# Patient Record
Sex: Male | Born: 1970 | Race: White | Hispanic: No | Marital: Single | Smoking: Current every day smoker
Health system: Southern US, Community
[De-identification: ages and names within clinical notes are randomized; demographics above are authoritative.]

## PROBLEM LIST (undated history)

## (undated) DIAGNOSIS — N200 Calculus of kidney: Secondary | ICD-10-CM

## (undated) DIAGNOSIS — Z973 Presence of spectacles and contact lenses: Secondary | ICD-10-CM

## (undated) DIAGNOSIS — K219 Gastro-esophageal reflux disease without esophagitis: Secondary | ICD-10-CM

## (undated) HISTORY — PX: LITHOTRIPSY: SUR834

## (undated) HISTORY — PX: WISDOM TOOTH EXTRACTION: SHX21

---

## 2009-06-01 ENCOUNTER — Ambulatory Visit: Payer: Self-pay | Admitting: Urology

## 2016-12-03 ENCOUNTER — Encounter: Payer: Self-pay | Admitting: Gastroenterology

## 2016-12-03 ENCOUNTER — Ambulatory Visit (INDEPENDENT_AMBULATORY_CARE_PROVIDER_SITE_OTHER): Payer: 59 | Admitting: Gastroenterology

## 2016-12-03 ENCOUNTER — Encounter (INDEPENDENT_AMBULATORY_CARE_PROVIDER_SITE_OTHER): Payer: Self-pay

## 2016-12-03 ENCOUNTER — Other Ambulatory Visit: Payer: Self-pay

## 2016-12-03 VITALS — BP 106/69 | HR 80 | Temp 97.5°F | Ht 72.0 in | Wt 207.0 lb

## 2016-12-03 DIAGNOSIS — R1310 Dysphagia, unspecified: Secondary | ICD-10-CM

## 2016-12-03 DIAGNOSIS — R1319 Other dysphagia: Secondary | ICD-10-CM

## 2016-12-03 NOTE — Progress Notes (Signed)
Gastroenterology Consultation  Referring Provider:     Linus Salmons, MD Primary Care Physician:  Patient, No Pcp Per Primary Gastroenterologist:  Dr. Servando Snare     Reason for Consultation:     Dysphagia        HPI:   Corey Cline is a 46 y.o. y/o male referred for consultation & management of Dysphagia by Dr. Patient, No Pcp Per.  This patient comes in today with a history of dysphagia.  The patient reports that sometimes he has to vomit his food up because he gets stuck.  The patient was seen by Dr. Jenne Campus and started on a PPI.  The patient states that since he started the PPI he has been eating well without any problems.  He also reports that he has been eating so much that is gained weight.  He denies any food getting stuck anymore.  The patient also denies any heartburn on the medication.  He did have some infrequent symptoms of reflux and regurgitation prior to starting the medication.  There is no report of any black stools or bloody stools.  History reviewed. No pertinent past medical history.  History reviewed. No pertinent surgical history.  Prior to Admission medications   Medication Sig Start Date End Date Taking? Authorizing Provider  omeprazole (PRILOSEC) 40 MG capsule TK 1 C PO QD 11/05/16  Yes [provider]  hyoscyamine (LEVSIN SL) 0.125 MG SL tablet  09/17/16   [provider]    History reviewed. No pertinent family history.   Social History  Substance Use Topics  . Smoking status: Current Every Day Smoker  . Smokeless tobacco: Never Used  . Alcohol use No    Allergies as of 12/03/2016  . (No Known Allergies)    Review of Systems:    All systems reviewed and negative except where noted in HPI.   Physical Exam:  BP 106/69   Pulse 80   Temp (!) 97.5 F (36.4 C) (Oral)   Ht 6' (1.829 m)   Wt 207 lb (93.9 kg)   BMI 28.07 kg/m  No LMP for male patient. Psych:  Alert and cooperative. Normal mood and affect. General:   Alert,   Well-developed, well-nourished, pleasant and cooperative in NAD Head:  Normocephalic and atraumatic. Eyes:  Sclera clear, no icterus.   Conjunctiva pink. Ears:  Normal auditory acuity. Nose:  No deformity, discharge, or lesions. Mouth:  No deformity or lesions,oropharynx pink & moist. Neck:  Supple; no masses or thyromegaly. Lungs:  Respirations even and unlabored.  Clear throughout to auscultation.   No wheezes, crackles, or rhonchi. No acute distress. Heart:  Regular rate and rhythm; no murmurs, clicks, rubs, or gallops. Abdomen:  Normal bowel sounds.  No bruits.  Soft, non-tender and non-distended without masses, hepatosplenomegaly or hernias noted.  No guarding or rebound tenderness.  Negative Carnett sign.   Rectal:  Deferred.  Msk:  Symmetrical without gross deformities.  Good, equal movement & strength bilaterally. Pulses:  Normal pulses noted. Extremities:  No clubbing or edema.  No cyanosis. Neurologic:  Alert and oriented x3;  grossly normal neurologically. Skin:  Intact without significant lesions or rashes.  No jaundice. Lymph Nodes:  No significant cervical adenopathy. Psych:  Alert and cooperative. Normal mood and affect.  Imaging Studies: No results found.  Assessment and Plan:   Corey Cline is a 46 y.o. y/o male who comes in today with a history of long-standing heartburn with dysphagia.  The patient has been  doing well on omeprazole.  The patient has had food stuck in the past and has been set up for a upper endoscopy to rule out any esophageal stricture or web. At the time of the endoscopy we can also rule out any sign of Barrett's esophagus. I have discussed risks & benefits which include, but are not limited to, bleeding, infection, perforation & drug reaction.  The patient agrees with this plan & written consent will be obtained.     Corey Miniumarren Evertte Sones, MD. Clementeen GrahamFACG   Note: This dictation was prepared with Dragon dictation along with smaller phrase technology. Any  transcriptional errors that result from this process are unintentional.

## 2016-12-09 ENCOUNTER — Encounter: Payer: Self-pay | Admitting: *Deleted

## 2016-12-09 NOTE — Discharge Instructions (Signed)
General Anesthesia, Adult, Care After °These instructions provide you with information about caring for yourself after your procedure. Your health care provider may also give you more specific instructions. Your treatment has been planned according to current medical practices, but problems sometimes occur. Call your health care provider if you have any problems or questions after your procedure. °What can I expect after the procedure? °After the procedure, it is common to have: °· Vomiting. °· A sore throat. °· Mental slowness. ° °It is common to feel: °· Nauseous. °· Cold or shivery. °· Sleepy. °· Tired. °· Sore or achy, even in parts of your body where you did not have surgery. ° °Follow these instructions at home: °For at least 24 hours after the procedure: °· Do not: °? Participate in activities where you could fall or become injured. °? Drive. °? Use heavy machinery. °? Drink alcohol. °? Take sleeping pills or medicines that cause drowsiness. °? Make important decisions or sign legal documents. °? Take care of children on your own. °· Rest. °Eating and drinking °· If you vomit, drink water, juice, or soup when you can drink without vomiting. °· Drink enough fluid to keep your urine clear or pale yellow. °· Make sure you have little or no nausea before eating solid foods. °· Follow the diet recommended by your health care provider. °General instructions °· Have a responsible adult stay with you until you are awake and alert. °· Return to your normal activities as told by your health care provider. Ask your health care provider what activities are safe for you. °· Take over-the-counter and prescription medicines only as told by your health care provider. °· If you smoke, do not smoke without supervision. °· Keep all follow-up visits as told by your health care provider. This is important. °Contact a health care provider if: °· You continue to have nausea or vomiting at home, and medicines are not helpful. °· You  cannot drink fluids or start eating again. °· You cannot urinate after 8-12 hours. °· You develop a skin rash. °· You have fever. °· You have increasing redness at the site of your procedure. °Get help right away if: °· You have difficulty breathing. °· You have chest pain. °· You have unexpected bleeding. °· You feel that you are having a life-threatening or urgent problem. °This information is not intended to replace advice given to you by your health care provider. Make sure you discuss any questions you have with your health care provider. °Document Released: 06/17/2000 Document Revised: 08/14/2015 Document Reviewed: 02/23/2015 °Elsevier Interactive Patient Education © 2018 [employer] ° °

## 2016-12-12 ENCOUNTER — Encounter
Admission: RE | Disposition: A | Discharge status: Home / Self Care | Payer: Self-pay | Source: Ambulatory Visit | Attending: Gastroenterology

## 2016-12-12 ENCOUNTER — Ambulatory Visit: Payer: 59 | Admitting: Anesthesiology

## 2016-12-12 ENCOUNTER — Ambulatory Visit
Admission: RE | Admit: 2016-12-12 | Discharge: 2016-12-12 | Disposition: A | Discharge status: Home / Self Care | Payer: 59 | Source: Ambulatory Visit | Attending: Gastroenterology | Admitting: Gastroenterology

## 2016-12-12 DIAGNOSIS — Z79899 Other long term (current) drug therapy: Secondary | ICD-10-CM | POA: Insufficient documentation

## 2016-12-12 DIAGNOSIS — Z87442 Personal history of urinary calculi: Secondary | ICD-10-CM | POA: Diagnosis not present

## 2016-12-12 DIAGNOSIS — R1310 Dysphagia, unspecified: Secondary | ICD-10-CM | POA: Diagnosis present

## 2016-12-12 DIAGNOSIS — K222 Esophageal obstruction: Secondary | ICD-10-CM | POA: Diagnosis not present

## 2016-12-12 DIAGNOSIS — F17210 Nicotine dependence, cigarettes, uncomplicated: Secondary | ICD-10-CM | POA: Insufficient documentation

## 2016-12-12 DIAGNOSIS — K219 Gastro-esophageal reflux disease without esophagitis: Secondary | ICD-10-CM | POA: Diagnosis not present

## 2016-12-12 HISTORY — DX: Gastro-esophageal reflux disease without esophagitis: K21.9

## 2016-12-12 HISTORY — DX: Presence of spectacles and contact lenses: Z97.3

## 2016-12-12 HISTORY — DX: Calculus of kidney: N20.0

## 2016-12-12 HISTORY — PX: ESOPHAGEAL DILATION: SHX303

## 2016-12-12 HISTORY — PX: ESOPHAGOGASTRODUODENOSCOPY (EGD) WITH PROPOFOL: SHX5813

## 2016-12-12 SURGERY — ESOPHAGOGASTRODUODENOSCOPY (EGD) WITH PROPOFOL
Anesthesia: General

## 2016-12-12 MED ORDER — GLYCOPYRROLATE 0.2 MG/ML IJ SOLN
INTRAMUSCULAR | Status: DC | PRN
  Administered 2016-12-12: 0.1 mg via INTRAVENOUS

## 2016-12-12 MED ORDER — LACTATED RINGERS IV SOLN
10.0000 mL/h | INTRAVENOUS | Status: DC
  Administered 2016-12-12: 10 mL/h via INTRAVENOUS

## 2016-12-12 MED ORDER — ACETAMINOPHEN 325 MG PO TABS: 325.0000 mg | ORAL_TABLET | ORAL | Status: DC | PRN

## 2016-12-12 MED ORDER — ONDANSETRON HCL 4 MG/2ML IJ SOLN: 4.0000 mg | Freq: Once | INTRAMUSCULAR | Status: DC | PRN

## 2016-12-12 MED ORDER — LIDOCAINE HCL (CARDIAC) 20 MG/ML IV SOLN
INTRAVENOUS | Status: DC | PRN
  Administered 2016-12-12: 50 mg via INTRAVENOUS

## 2016-12-12 MED ORDER — STERILE WATER FOR IRRIGATION IR SOLN
Status: DC | PRN
  Administered 2016-12-12: 09:00:00

## 2016-12-12 MED ORDER — SODIUM CHLORIDE 0.9 % IV SOLN: INTRAVENOUS | Status: DC

## 2016-12-12 MED ORDER — PROPOFOL 10 MG/ML IV BOLUS
INTRAVENOUS | Status: DC | PRN
  Administered 2016-12-12 (×3): 30 mg via INTRAVENOUS
  Administered 2016-12-12: 20 mg via INTRAVENOUS
  Administered 2016-12-12: 120 mg via INTRAVENOUS

## 2016-12-12 MED ORDER — ACETAMINOPHEN 160 MG/5ML PO SOLN: 325.0000 mg | ORAL | Status: DC | PRN

## 2016-12-12 SURGICAL SUPPLY — 32 items
BALLN DILATOR 10-12 8 (BALLOONS)
BALLN DILATOR 12-15 8 (BALLOONS)
BALLN DILATOR 15-18 8 (BALLOONS) ×3
BALLN DILATOR CRE 0-12 8 (BALLOONS)
BALLN DILATOR ESOPH 8 10 CRE (MISCELLANEOUS) IMPLANT
BALLOON DILATOR 12-15 8 (BALLOONS) IMPLANT
BALLOON DILATOR 15-18 8 (BALLOONS) ×1 IMPLANT
BALLOON DILATOR CRE 0-12 8 (BALLOONS) IMPLANT
BLOCK BITE 60FR ADLT L/F GRN (MISCELLANEOUS) ×3 IMPLANT
CANISTER SUCT 1200ML W/VALVE (MISCELLANEOUS) ×3 IMPLANT
CLIP HMST 235XBRD CATH ROT (MISCELLANEOUS) IMPLANT
CLIP RESOLUTION 360 11X235 (MISCELLANEOUS)
FCP ESCP3.2XJMB 240X2.8X (MISCELLANEOUS)
FORCEPS BIOP RAD 4 LRG CAP 4 (CUTTING FORCEPS) IMPLANT
FORCEPS BIOP RJ4 240 W/NDL (MISCELLANEOUS)
FORCEPS ESCP3.2XJMB 240X2.8X (MISCELLANEOUS) IMPLANT
GOWN CVR UNV OPN BCK APRN NK (MISCELLANEOUS) ×2 IMPLANT
GOWN ISOL THUMB LOOP REG UNIV (MISCELLANEOUS) ×4
INJECTOR VARIJECT VIN23 (MISCELLANEOUS) IMPLANT
KIT DEFENDO VALVE AND CONN (KITS) IMPLANT
KIT ENDO PROCEDURE OLY (KITS) ×3 IMPLANT
MARKER SPOT ENDO TATTOO 5ML (MISCELLANEOUS) IMPLANT
PAD GROUND ADULT SPLIT (MISCELLANEOUS) IMPLANT
RETRIEVER NET PLAT FOOD (MISCELLANEOUS) IMPLANT
SNARE SHORT THROW 13M SML OVAL (MISCELLANEOUS) IMPLANT
SNARE SHORT THROW 30M LRG OVAL (MISCELLANEOUS) IMPLANT
SPOT EX ENDOSCOPIC TATTOO (MISCELLANEOUS)
SYR INFLATION 60ML (SYRINGE) IMPLANT
TRAP ETRAP POLY (MISCELLANEOUS) IMPLANT
VARIJECT INJECTOR VIN23 (MISCELLANEOUS)
WATER STERILE IRR 250ML POUR (IV SOLUTION) ×3 IMPLANT
WIRE CRE 18-20MM 8CM F G (MISCELLANEOUS) IMPLANT

## 2016-12-12 NOTE — Anesthesia Preprocedure Evaluation (Signed)
Anesthesia Evaluation  Patient identified by MRN, date of birth, ID band Patient awake    Reviewed: Allergy & Precautions, NPO status   Airway Mallampati: I  TM Distance: >3 FB     Dental no notable dental hx.    Pulmonary Current Smoker,    breath sounds clear to auscultation       Cardiovascular negative cardio ROS   Rhythm:Regular Rate:Normal     Neuro/Psych negative neurological ROS     GI/Hepatic GERD  Controlled,  Endo/Other  negative endocrine ROS  Renal/GU      Musculoskeletal   Abdominal   Peds  Hematology   Anesthesia Other Findings   Reproductive/Obstetrics                            Anesthesia Physical Anesthesia Plan  ASA: II  Anesthesia Plan: General   Post-op Pain Management:    Induction: Intravenous  PONV Risk Score and Plan:   Airway Management Planned: Nasal Cannula and Natural Airway  Additional Equipment:   Intra-op Plan:   Post-operative Plan:   Informed Consent: I have reviewed the patients History and Physical, chart, labs and discussed the procedure including the risks, benefits and alternatives for the proposed anesthesia with the patient or authorized representative who has indicated his/her understanding and acceptance.   Dental advisory given  Plan Discussed with: CRNA  Anesthesia Plan Comments:         Anesthesia Quick Evaluation

## 2016-12-12 NOTE — Anesthesia Procedure Notes (Signed)
Procedure Name: MAC Date/Time: 12/12/2016 8:57 AM Performed by: Cameron Ali Pre-anesthesia Checklist: Patient identified, Emergency Drugs available, Suction available, Timeout performed and Patient being monitored Patient Re-evaluated:Patient Re-evaluated prior to induction Oxygen Delivery Method: Nasal cannula Placement Confirmation: positive ETCO2

## 2016-12-12 NOTE — Op Note (Signed)
[facility] Gastroenterology Patient Name: Corey Cline Procedure Date: 12/12/2016 8:48 AM MRN: [id number] Account #: [id number] Date of Birth: 19-May-1970 Admit Type: Outpatient Age: 46 Room: [facility] OR ROOM 01 Gender: Male Note Status: Finalized Procedure:            Upper GI endoscopy Indications:          Dysphagia Providers:            Midge Minium MD, MD Referring MD:         Kris Mouton. Gerilyn Pilgrim (Referring MD) Medicines:            Propofol per Anesthesia Complications:        No immediate complications. Procedure:            Pre-Anesthesia Assessment:                       - Prior to the procedure, a History and Physical was                        performed, and patient medications and allergies were                        reviewed. The patient's tolerance of previous                        anesthesia was also reviewed. The risks and benefits of                        the procedure and the sedation options and risks were                        discussed with the patient. All questions were                        answered, and informed consent was obtained. Prior                        Anticoagulants: The patient has taken no previous                        anticoagulant or antiplatelet agents. ASA Grade                        Assessment: II - A patient with mild systemic disease.                        After reviewing the risks and benefits, the patient was                        deemed in satisfactory condition to undergo the                        procedure.                       After obtaining informed consent, the endoscope was                        passed under direct vision. Throughout the procedure,  the patient's blood pressure, pulse, and oxygen                        saturations were monitored continuously. The Olympus                        GIF-HQ190 Endoscope (S#. [id number]) was introduced                        through the  mouth, and advanced to the second part of                        duodenum. The upper GI endoscopy was accomplished                        without difficulty. The patient tolerated the procedure                        well. Findings:      One moderate benign-appearing, intrinsic stenosis was found at the       gastroesophageal junction. And was traversed. A TTS dilator was passed       through the scope. Dilation with a 12-13.5-15 mm balloon dilator was       performed to 15 mm. The dilation site was examined following endoscope       reinsertion and showed moderate improvement in luminal narrowing.      The stomach was normal.      The examined duodenum was normal. Impression:           - Benign-appearing esophageal stenosis. Dilated.                       - Normal stomach.                       - Normal examined duodenum.                       - No specimens collected. Recommendation:       - Discharge patient to home.                       - Resume previous diet.                       - Continue present medications.                       - Repeat upper endoscopy PRN for retreatment. Procedure Code(s):    --- Professional ---                       [phone], Esophagogastroduodenoscopy, flexible, transoral;                        with transendoscopic balloon dilation of esophagus                        (less than 30 mm diameter) Diagnosis Code(s):    --- Professional ---                       R13.10, Dysphagia, unspecified  K22.2, Esophageal obstruction CPT copyright 2016 American Medical Association. All rights reserved. The codes documented in this report are preliminary and upon coder review may  be revised to meet current compliance requirements. Midge Minium MD, MD 12/12/2016 9:13:02 AM This report has been signed electronically. Number of Addenda: 0 Note Initiated On: 12/12/2016 8:48 AM      [facility]

## 2016-12-12 NOTE — Anesthesia Postprocedure Evaluation (Signed)
Anesthesia Post Note  Patient: Corey Cline  Procedure(s) Performed: Procedure(s) (LRB): ESOPHAGOGASTRODUODENOSCOPY (EGD) WITH PROPOFOL (N/A) ESOPHAGEAL DILATION (N/A)  Patient location during evaluation: PACU Anesthesia Type: General Level of consciousness: awake Pain management: pain level controlled Vital Signs Assessment: post-procedure vital signs reviewed and stable Respiratory status: respiratory function stable Cardiovascular status: stable Postop Assessment: no signs of nausea or vomiting Anesthetic complications: no    Jola Babinski

## 2016-12-12 NOTE — Transfer of Care (Signed)
Immediate Anesthesia Transfer of Care Note  Patient: Corey Cline  Procedure(s) Performed: Procedure(s) with comments: ESOPHAGOGASTRODUODENOSCOPY (EGD) WITH PROPOFOL (N/A) - requests early ESOPHAGEAL DILATION (N/A)  Patient Location: PACU  Anesthesia Type: General  Level of Consciousness: awake, alert  and patient cooperative  Airway and Oxygen Therapy: Patient Spontanous Breathing and Patient connected to supplemental oxygen  Post-op Assessment: Post-op Vital signs reviewed, Patient's Cardiovascular Status Stable, Respiratory Function Stable, Patent Airway and No signs of Nausea or vomiting  Post-op Vital Signs: Reviewed and stable  Complications: No apparent anesthesia complications

## 2016-12-12 NOTE — H&P (Signed)
   Midge Minium, MD [facility] [address] [place], [place] [address] [phone] Fax : [phone]  Primary Care Physician:  Patient, No Pcp Per Primary Gastroenterologist:  Dr. Servando Snare  Pre-Procedure History & Physical: HPI:  Corey Cline is a 46 y.o. male is here for an endoscopy.   Past Medical History:  Diagnosis Date  . GERD (gastroesophageal reflux disease)   . Kidney stones   . Wears contact lenses     Past Surgical History:  Procedure Laterality Date  . LITHOTRIPSY    . WISDOM TOOTH EXTRACTION      Prior to Admission medications   Medication Sig Start Date End Date Taking? Authorizing Provider  omeprazole (PRILOSEC) 40 MG capsule TK 1 C PO QD 11/05/16  Yes [provider]  hyoscyamine (LEVSIN SL) 0.125 MG SL tablet  09/17/16   [provider]    Allergies as of 12/03/2016  . (No Known Allergies)    History reviewed. No pertinent family history.  Social History   Social History  . Marital status: Single    Spouse name: N/A  . Number of children: N/A  . Years of education: N/A   Occupational History  . Not on file.   Social History Main Topics  . Smoking status: Current Every Day Smoker    Packs/day: 0.25    Years: 10.00  . Smokeless tobacco: Never Used  . Alcohol use 3.6 oz/week    6 Cans of beer per week  . Drug use: No  . Sexual activity: Not on file   Other Topics Concern  . Not on file   Social History Narrative  . No narrative on file    Review of Systems: See HPI, otherwise negative ROS  Physical Exam: BP 105/76   Pulse 63   Temp 98.1 F (36.7 C) (Temporal)   Resp 16   Ht 6' (1.829 m)   Wt 204 lb (92.5 kg)   SpO2 98%   BMI 27.67 kg/m  General:   Alert,  pleasant and cooperative in NAD Head:  Normocephalic and atraumatic. Neck:  Supple; no masses or thyromegaly. Lungs:  Clear throughout to auscultation.    Heart:  Regular rate and rhythm. Abdomen:  Soft, nontender and nondistended. Normal  bowel sounds, without guarding, and without rebound.   Neurologic:  Alert and  oriented x4;  grossly normal neurologically.  Impression/Plan: Corey Cline is here for an endoscopy to be performed for Dysphagia  Risks, benefits, limitations, and alternatives regarding  endoscopy have been reviewed with the patient.  Questions have been answered.  All parties agreeable.   Midge Minium, MD  12/12/2016, 8:21 AM

## 2016-12-13 ENCOUNTER — Encounter: Payer: Self-pay | Admitting: Gastroenterology

## 2018-03-05 ENCOUNTER — Emergency Department: Payer: 59

## 2018-03-05 ENCOUNTER — Encounter: Payer: Self-pay | Admitting: Emergency Medicine

## 2018-03-05 ENCOUNTER — Emergency Department
Admission: EM | Admit: 2018-03-05 | Discharge: 2018-03-05 | Disposition: A | Discharge status: Home / Self Care | Payer: 59 | Attending: Emergency Medicine | Admitting: Emergency Medicine

## 2018-03-05 DIAGNOSIS — Z79899 Other long term (current) drug therapy: Secondary | ICD-10-CM | POA: Diagnosis not present

## 2018-03-05 DIAGNOSIS — N200 Calculus of kidney: Secondary | ICD-10-CM | POA: Insufficient documentation

## 2018-03-05 DIAGNOSIS — F17210 Nicotine dependence, cigarettes, uncomplicated: Secondary | ICD-10-CM | POA: Diagnosis not present

## 2018-03-05 DIAGNOSIS — R339 Retention of urine, unspecified: Secondary | ICD-10-CM | POA: Diagnosis present

## 2018-03-05 DIAGNOSIS — R109 Unspecified abdominal pain: Secondary | ICD-10-CM | POA: Diagnosis not present

## 2018-03-05 LAB — URINALYSIS, COMPLETE (UACMP) WITH MICROSCOPIC
BACTERIA UA: NONE SEEN
Bilirubin Urine: NEGATIVE
Glucose, UA: NEGATIVE mg/dL
KETONES UR: NEGATIVE mg/dL
Leukocytes, UA: NEGATIVE
Nitrite: NEGATIVE
Protein, ur: 30 mg/dL — AB
RBC / HPF: >50 RBC/hpf — ABNORMAL HIGH (ref 0–5)
Specific Gravity, Urine: 1.011 (ref 1.005–1.030)
pH: 6 (ref 5.0–8.0)

## 2018-03-05 LAB — COMPREHENSIVE METABOLIC PANEL
ALBUMIN: 4.7 g/dL (ref 3.5–5.0)
ALK PHOS: 72 U/L (ref 38–126)
ALT: 33 U/L (ref 0–44)
ANION GAP: 9 (ref 5–15)
AST: 31 U/L (ref 15–41)
BUN: 10 mg/dL (ref 6–20)
CALCIUM: 9.6 mg/dL (ref 8.9–10.3)
CO2: 24 mmol/L (ref 22–32)
Chloride: 104 mmol/L (ref 98–111)
Creatinine, Ser: 0.85 mg/dL (ref 0.61–1.24)
GFR calc Af Amer: >60 mL/min (ref >60)
GFR calc non Af Amer: >60 mL/min (ref >60)
Glucose, Bld: 136 mg/dL — ABNORMAL HIGH (ref 70–99)
Potassium: 3.8 mmol/L (ref 3.5–5.1)
Sodium: 137 mmol/L (ref 135–145)
TOTAL PROTEIN: 8.2 g/dL — AB (ref 6.5–8.1)
Total Bilirubin: 1 mg/dL (ref 0.3–1.2)

## 2018-03-05 LAB — CBC
HEMATOCRIT: 48.7 % (ref 39.0–52.0)
Hemoglobin: 16.4 g/dL (ref 13.0–17.0)
MCH: 29.8 pg (ref 26.0–34.0)
MCHC: 33.7 g/dL (ref 30.0–36.0)
MCV: 88.5 fL (ref 80.0–100.0)
NRBC: 0 % (ref 0.0–0.2)
Platelets: 284 10*3/uL (ref 150–400)
RBC: 5.5 MIL/uL (ref 4.22–5.81)
RDW: 13 % (ref 11.5–15.5)
WBC: 19.5 10*3/uL — ABNORMAL HIGH (ref 4.0–10.5)

## 2018-03-05 MED ORDER — ONDANSETRON HCL 4 MG PO TABS: 4.0000 mg | ORAL_TABLET | Freq: Three times a day (TID) | ORAL | 0 refills | Status: AC | PRN

## 2018-03-05 MED ORDER — OXYCODONE-ACETAMINOPHEN 5-325 MG PO TABS: 1.0000 | ORAL_TABLET | ORAL | 0 refills | Status: AC | PRN

## 2018-03-05 MED ORDER — SODIUM CHLORIDE 0.9 % IV BOLUS
1000.0000 mL | Freq: Once | INTRAVENOUS | Status: AC
  Administered 2018-03-05: 1000 mL via INTRAVENOUS

## 2018-03-05 MED ORDER — IBUPROFEN 200 MG PO TABS: 600.0000 mg | ORAL_TABLET | Freq: Four times a day (QID) | ORAL | 0 refills | Status: AC | PRN

## 2018-03-05 MED ORDER — TAMSULOSIN HCL 0.4 MG PO CAPS: 0.4000 mg | ORAL_CAPSULE | Freq: Every day | ORAL | 0 refills | Status: AC

## 2018-03-05 NOTE — ED Provider Notes (Addendum)
[facility] Emergency Department Provider Note  ____________________________________________   I have reviewed the triage vital signs and the nursing notes. Where available I have reviewed prior notes and, if possible and indicated, outside hospital notes.    HISTORY  Chief Complaint Urinary Retention; Flank Pain; and Back Pain    HPI Corey Cline is a 47 y.o. male the history he states of kidney stones, although we have no significant records here aside from 1 KUB to suggest that that is the case, presents with sudden onset back pain which resulted in some degree of bladder spasm or what he describes as bladder spasm.  He states he was having difficulty completing his urination and has not urinated since.  Does not have any fever chills vomiting or diarrhea.  Pain is in the right flank.  Seems similar to prior kidney stones.  No numbness no weakness no other complaints   Past Medical History:  Diagnosis Date  . GERD (gastroesophageal reflux disease)   . Kidney stones   . Wears contact lenses     There are no active problems to display for this patient.   Past Surgical History:  Procedure Laterality Date  . ESOPHAGEAL DILATION N/A 12/12/2016   Procedure: ESOPHAGEAL DILATION;  Surgeon: Midge Minium, MD;  Location: [facility];  Service: Gastroenterology;  Laterality: N/A;  . ESOPHAGOGASTRODUODENOSCOPY (EGD) WITH PROPOFOL N/A 12/12/2016   Procedure: ESOPHAGOGASTRODUODENOSCOPY (EGD) WITH PROPOFOL;  Surgeon: Midge Minium, MD;  Location: [facility];  Service: Gastroenterology;  Laterality: N/A;  requests early  . LITHOTRIPSY    . WISDOM TOOTH EXTRACTION      Prior to Admission medications   Medication Sig Start Date End Date Taking? Authorizing Provider  hyoscyamine (LEVSIN SL) 0.125 MG SL tablet  09/17/16   [provider]  omeprazole (PRILOSEC) 40 MG capsule TK 1 C PO QD 11/05/16   [provider]     Allergies Patient has no known allergies.  No family history on file.  Social History Social History   Tobacco Use  . Smoking status: Current Every Day Smoker    Packs/day: 0.25    Years: 10.00    Pack years: 2.50  . Smokeless tobacco: Never Used  Substance Use Topics  . Alcohol use: Yes    Alcohol/week: 6.0 standard drinks    Types: 6 Cans of beer per week  . Drug use: No    Review of Systems Constitutional: No fever/chills Eyes: No visual changes. ENT: No sore throat. No stiff neck no neck pain Cardiovascular: Denies chest pain. Respiratory: Denies shortness of breath. Gastrointestinal:   no vomiting.  No diarrhea.  No constipation. Genitourinary: Negative for dysuria. Musculoskeletal: Negative lower extremity swelling Skin: Negative for rash. Neurological: Negative for severe headaches, focal weakness or numbness.   ____________________________________________   PHYSICAL EXAM:  VITAL SIGNS: ED Triage Vitals [03/05/18 1010]  Enc Vitals Group     BP 138/87     Pulse Rate 67     Resp 20     Temp 97.6 F (36.4 C)     Temp Source Oral     SpO2 99 %     Weight 200 lb (90.7 kg)     Height 6' (1.829 m)     Head Circumference      Peak Flow      Pain Score 7     Pain Loc      Pain Edu?      Excl. in GC?  Constitutional: Alert and oriented. Well appearing and in no acute distress. Eyes: Conjunctivae are normal Head: Atraumatic HEENT: No congestion/rhinnorhea. Mucous membranes are moist.  Oropharynx non-erythematous Neck:   Nontender with no meningismus, no masses, no stridor Cardiovascular: Normal rate, regular rhythm. Grossly normal heart sounds.  Good peripheral circulation. Respiratory: Normal respiratory effort.  No retractions. Lungs CTAB. Abdominal: Soft and nontender. No distention. No guarding no rebound Back:  There is no focal tenderness or step off.  there is no midline tenderness there are no lesions noted. there is some right CVA  tenderness Normal external genitalia, no lesions or masses Musculoskeletal: No lower extremity tenderness, no upper extremity tenderness. No joint effusions, no DVT signs strong distal pulses no edema Neurologic:  Normal speech and language. No gross focal neurologic deficits are appreciated.  Skin:  Skin is warm, dry and intact. No rash noted. Psychiatric: Mood and affect are normal. Speech and behavior are normal.  ____________________________________________   LABS (all labs ordered are listed, but only abnormal results are displayed)  Labs Reviewed  CBC - Abnormal; Notable for the following components:      Result Value   WBC 19.5 (*)    All other components within normal limits  COMPREHENSIVE METABOLIC PANEL - Abnormal; Notable for the following components:   Glucose, Bld 136 (*)    Total Protein 8.2 (*)    All other components within normal limits  URINALYSIS, COMPLETE (UACMP) WITH MICROSCOPIC    Pertinent labs  results that were available during my care of the patient were reviewed by me and considered in my medical decision making (see chart for details). ____________________________________________  EKG  I personally interpreted any EKGs ordered by me or triage  ____________________________________________  RADIOLOGY  Pertinent labs & imaging results that were available during my care of the patient were reviewed by me and considered in my medical decision making (see chart for details). If possible, patient and/or family made aware of any abnormal findings.  No results found. ____________________________________________    PROCEDURES  Procedure(s) performed: None  Procedures  Critical Care performed: None  ____________________________________________   INITIAL IMPRESSION / ASSESSMENT AND PLAN / ED COURSE  Pertinent labs & imaging results that were available during my care of the patient were reviewed by me and considered in my medical decision making  (see chart for details).   Patient here with sudden onset pain, he states at this time his pain is nearly gone and he does not want any pain medication he understands that if he changes his mind we can certainly give him some.  His bladder scan does not show any significant urinary retention he is most likely having bladder him from the attempt to pass a stone.  Do see a prior x-ray with stones and I do not see any prior CT scan.  Given his white count of nearly 20, his complaints of urinary difficulty, all of which certainly could be an uninfected kidney stone, I do think it might be in his best interest to have better imaging we will obtain a CT scan.  Urinalysis is pending I am going to give him IV fluid.  He is in no acute distress resting comfortably in the bed most likely this is renal colic.  ----------------------------------------- 1:23 PM on 03/05/2018 -----------------------------------------  Continues to pain complain of no pain at this time, he states sometimes it comes and goes before he can call the nurse and at this time he is pain-free, he does not want  pain medication here but he does want it at home.  No evidence of infected stone, we will send a urine culture as a precaution although again no reason to suspect that this man has a urinary tract infection.  He will follow closely with his urologist return precautions follow-up given and understood   ____________________________________________   FINAL CLINICAL IMPRESSION(S) / ED DIAGNOSES  Final diagnoses:  None      This chart was dictated using voice recognition software.  Despite best efforts to proofread,  errors can occur which can change meaning.      Jeanmarie PlantMcShane, Kalilah Barua A, MD 03/05/18 1137    Jeanmarie PlantMcShane, Irwin Toran A, MD 03/05/18 1323

## 2018-03-05 NOTE — ED Notes (Signed)
Patient transported to CT 

## 2018-03-05 NOTE — ED Notes (Signed)
Pt verbalizes d.c understanding , follow up and RX. Pt in NAD, VSS, pt unable to sign due to signature pad malfnx

## 2018-03-05 NOTE — ED Notes (Signed)
Bladder scanner results 102 ml.

## 2018-03-05 NOTE — ED Triage Notes (Signed)
Pt reports awoke at 3am tom use the restroom was only able to get a small amount out and has pain to his right lower back/flank. Pt reports NV as well. Pt with hx of stones and states that it feels the same.

## 2018-03-06 LAB — URINE CULTURE: Culture: NO GROWTH

## 2019-05-05 IMAGING — CT CT RENAL STONE PROTOCOL
2 of 4 series · 16 of 46 positions shown, 18 images · non-contrast
Comparison: None.

CLINICAL DATA: Flank pain, right side

EXAM:
CT ABDOMEN AND PELVIS WITHOUT CONTRAST
TECHNIQUE: Multidetector CT imaging of the abdomen and pelvis was performed
following the standard protocol without oral or IV contrast.

[Series 2: stone full standard · axial · 0.73mm/px · z∈[-527,-67]mm · 13 of 100 slices shown, 15 images]
[im 4/100  soft-tissue]
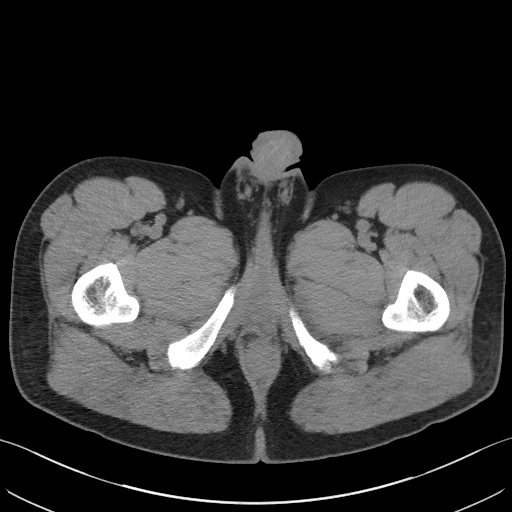
[im 4/100  bone]
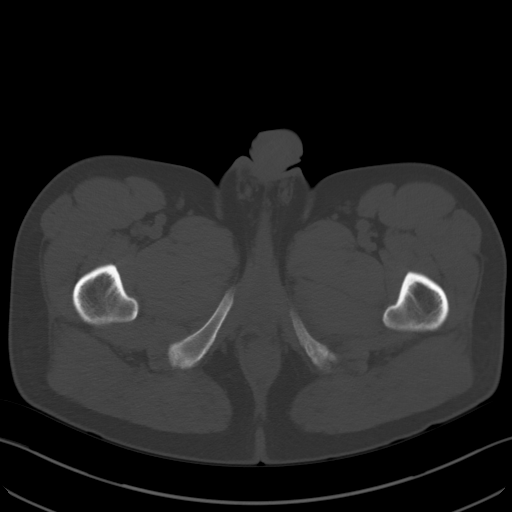
[im 12/100  soft-tissue]
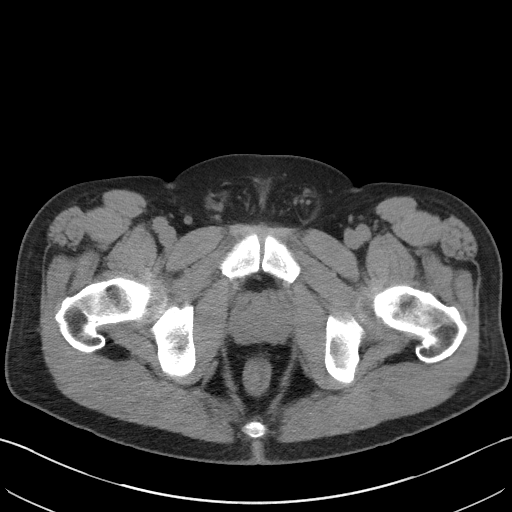
[im 20/100  soft-tissue]
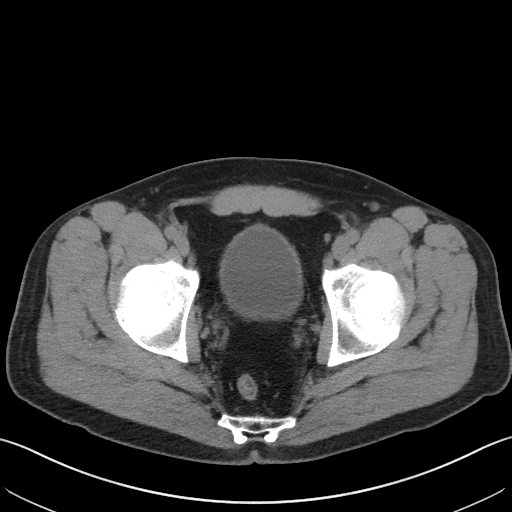
[im 28/100  soft-tissue]
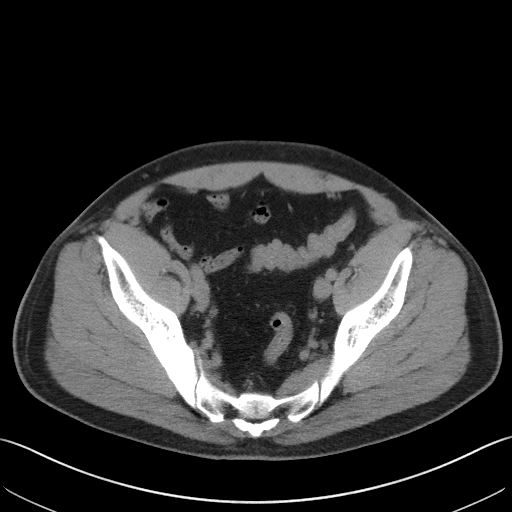
[im 36/100  soft-tissue]
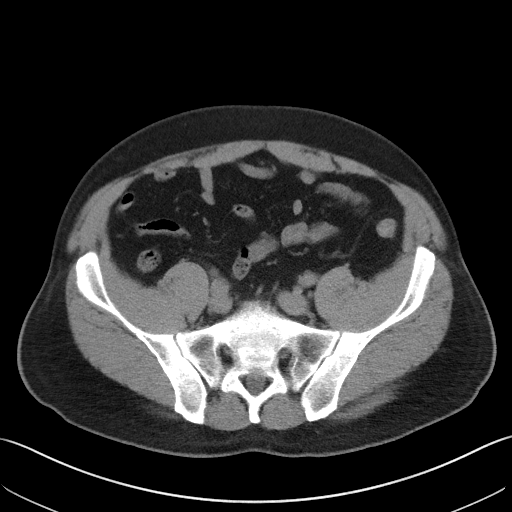
[im 44/100  soft-tissue]
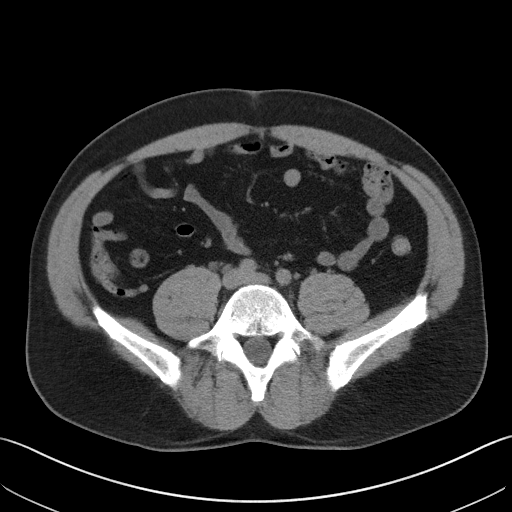
[im 52/100  soft-tissue]
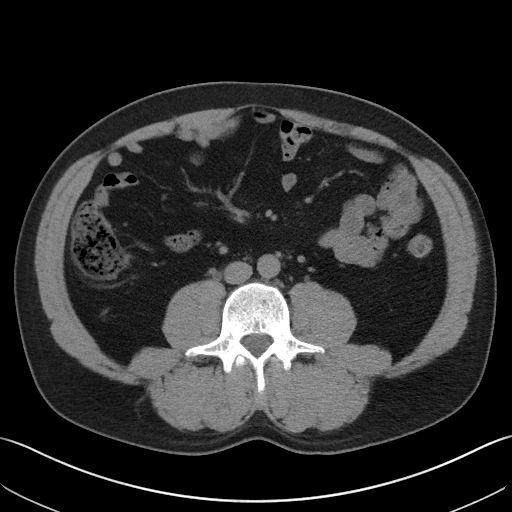
[im 56/100  soft-tissue]
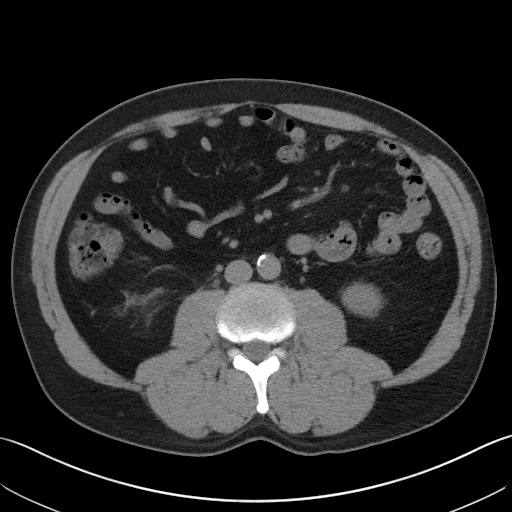
[im 64/100  soft-tissue]
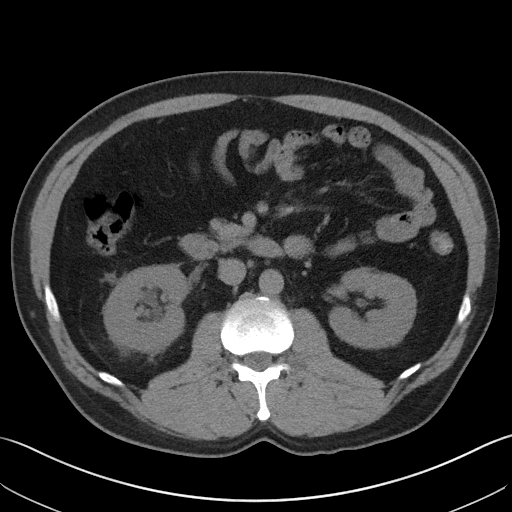
[im 64/100  bone]
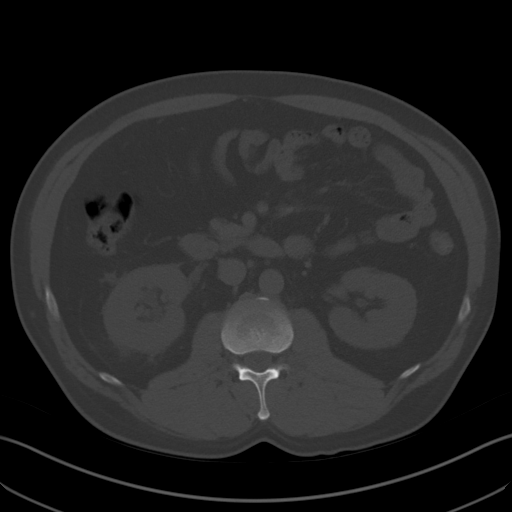
[im 72/100  soft-tissue]
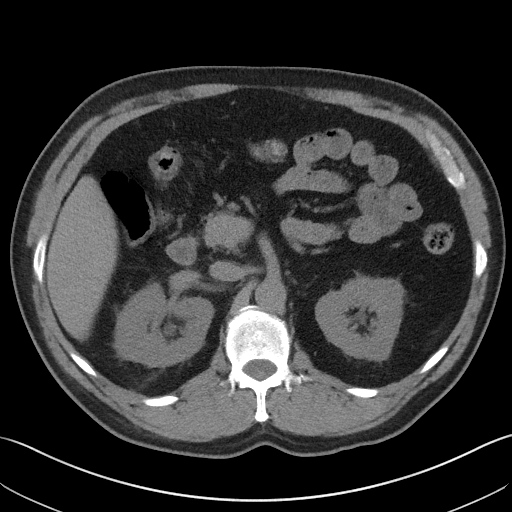
[im 80/100  soft-tissue]
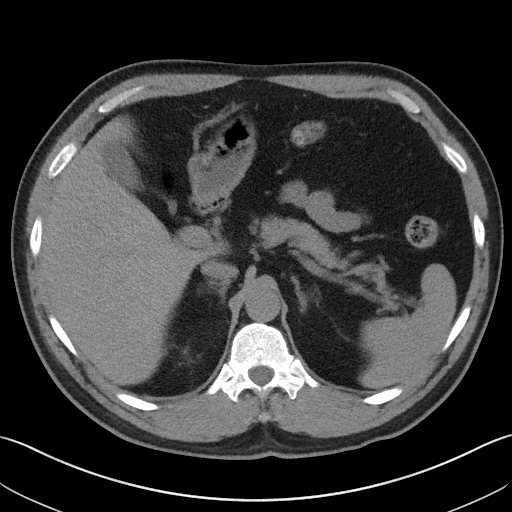
[im 88/100  soft-tissue]
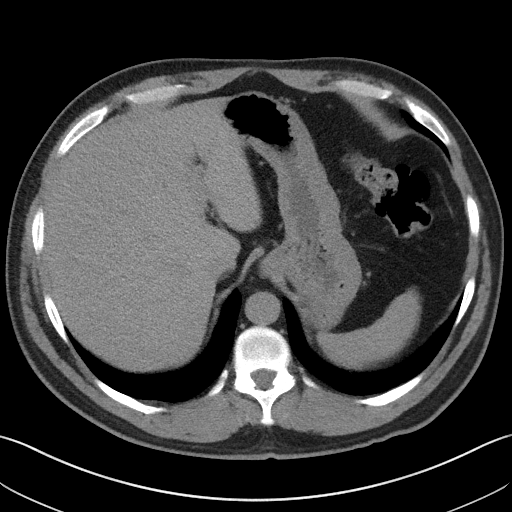
[im 96/100  soft-tissue]
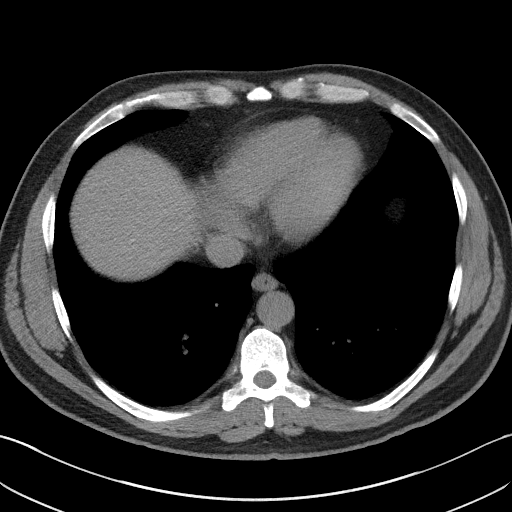

[Series 5: coronal · coronal · 0.78mm/px · 3 of 152 slices shown]
[im 51/152  soft-tissue]
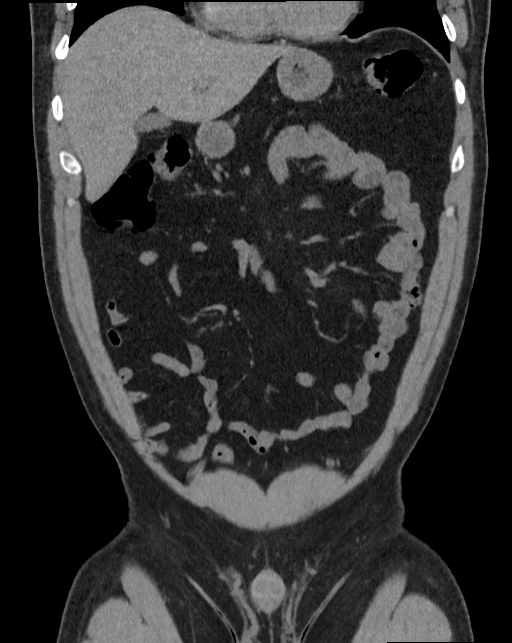
[im 68/152  soft-tissue]
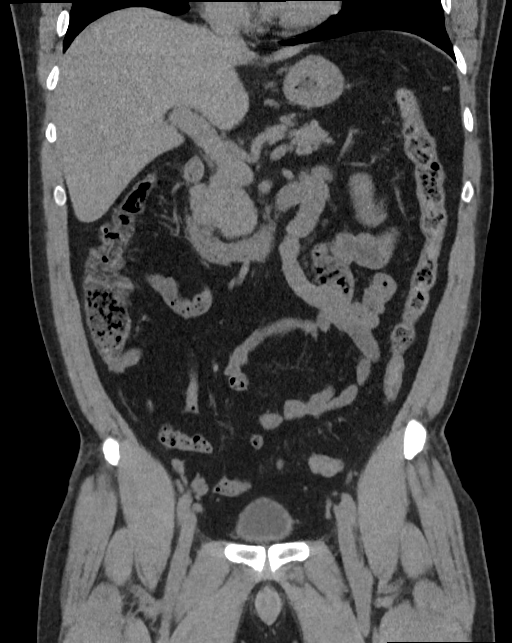
[im 84/152  soft-tissue]
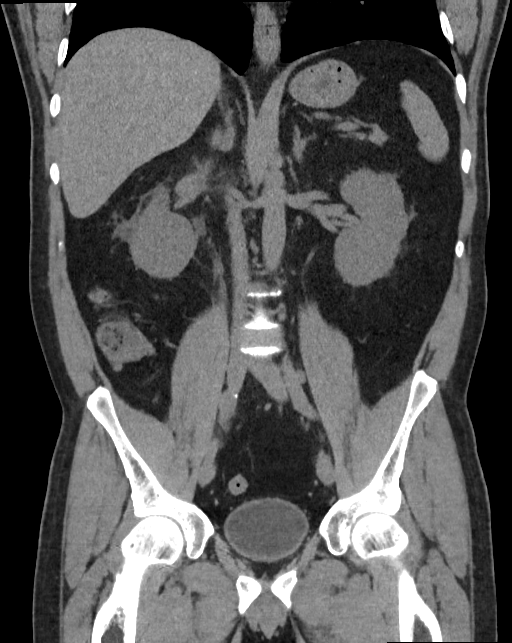

[16 of 46 positions shown; findings below may reference images not displayed]

FINDINGS: Lower chest: Lung bases are clear.

Hepatobiliary: No focal liver lesions are appreciable on this
noncontrast enhanced study. Gallbladder wall is not appreciably
thickened. There is no biliary duct dilatation.

Pancreas: There is no pancreatic mass or inflammatory focus.

Spleen: No splenic lesions are evident.

Adrenals/Urinary Tract: Adrenals bilaterally appear unremarkable.
There is moderate perinephric edema and stranding on the right.
There is an 8 x 8 mm cyst arising from the posterior mid right
kidney. There is moderate hydronephrosis on the right. There is no
appreciable hydronephrosis on the left. There is no appreciable
intrarenal calculus on either side. There is a 2 mm calculus at the
right ureterovesical junction. No other ureteral calculi are evident
on either side. Urinary bladder is midline with wall thickness
within normal limits.

Stomach/Bowel: There is no appreciable bowel wall or mesenteric
thickening. There is no evident bowel obstruction. There is no free
air or portal venous air.

Vascular/Lymphatic: There is no abdominal aortic aneurysm. There is
aortic atherosclerosis. Major mesenteric arterial vessels appear
patent on this noncontrast enhanced study. There is no evident
adenopathy in the abdomen or pelvis.

Reproductive: There is a small prostatic calculus on the left.
Prostate and seminal vesicles are normal in size and contour. No
pelvic mass evident.

Other: The appendix appears normal. There is no abscess or ascites
in the abdomen or pelvis.

Musculoskeletal: There are no blastic or lytic bone lesions. There
is no intramuscular or abdominal wall lesion.
IMPRESSION: 1. 2 mm calculus right ureterovesical junction with moderate
hydronephrosis an ureterectasis on the right. There is perinephric
stranding and edema right kidney.

2. No evident bowel obstruction. No abscess in the abdomen or
pelvis. Appendix appears normal.

3.  Small left-sided prostatic calculus.

4.  Aortic atherosclerosis.

Aortic Atherosclerosis ([id number]).
